# Patient Record
Sex: Female | Born: 1985 | Race: White | Hispanic: No | Marital: Single | Smoking: Former smoker
Health system: Southern US, Community
[De-identification: ages and names within clinical notes are randomized; demographics above are authoritative.]

## PROBLEM LIST (undated history)

## (undated) DIAGNOSIS — K5090 Crohn's disease, unspecified, without complications: Secondary | ICD-10-CM

## (undated) DIAGNOSIS — K633 Ulcer of intestine: Secondary | ICD-10-CM

## (undated) HISTORY — DX: Crohn's disease, unspecified, without complications: K50.90

## (undated) HISTORY — DX: Ulcer of intestine: K63.3

## (undated) HISTORY — PX: OTHER SURGICAL HISTORY: SHX169

---
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
## 2007-08-04 ENCOUNTER — Encounter (INDEPENDENT_AMBULATORY_CARE_PROVIDER_SITE_OTHER): Payer: Self-pay | Admitting: *Deleted

## 2007-08-05 ENCOUNTER — Encounter (INDEPENDENT_AMBULATORY_CARE_PROVIDER_SITE_OTHER): Payer: Self-pay | Admitting: *Deleted

## 2007-08-17 ENCOUNTER — Encounter: Payer: Self-pay | Admitting: Internal Medicine

## 2009-06-19 ENCOUNTER — Encounter (INDEPENDENT_AMBULATORY_CARE_PROVIDER_SITE_OTHER): Payer: Self-pay | Admitting: *Deleted

## 2009-11-12 ENCOUNTER — Encounter: Payer: Self-pay | Admitting: Internal Medicine

## 2009-12-19 DIAGNOSIS — N946 Dysmenorrhea, unspecified: Secondary | ICD-10-CM

## 2009-12-19 DIAGNOSIS — K5090 Crohn's disease, unspecified, without complications: Secondary | ICD-10-CM | POA: Insufficient documentation

## 2009-12-19 DIAGNOSIS — K633 Ulcer of intestine: Secondary | ICD-10-CM

## 2009-12-24 ENCOUNTER — Encounter: Payer: Self-pay | Admitting: Internal Medicine

## 2009-12-29 DIAGNOSIS — K120 Recurrent oral aphthae: Secondary | ICD-10-CM | POA: Insufficient documentation

## 2009-12-30 ENCOUNTER — Ambulatory Visit: Payer: Self-pay | Admitting: Internal Medicine

## 2009-12-30 LAB — CONVERTED CEMR LAB
Eosinophils Relative: 3.6 % (ref 0.0–5.0)
Iron: 131 ug/dL (ref 42–145)
MCV: 88.9 fL (ref 78.0–100.0)
Monocytes Absolute: 0.5 10*3/uL (ref 0.1–1.0)
Neutrophils Relative %: 57.5 % (ref 43.0–77.0)
Platelets: 183 10*3/uL (ref 150.0–400.0)
Transferrin: 318.8 mg/dL (ref 212.0–360.0)
WBC: 8.8 10*3/uL (ref 4.5–10.5)

## 2010-03-25 ENCOUNTER — Ambulatory Visit: Payer: Self-pay | Admitting: Gastroenterology

## 2010-03-25 ENCOUNTER — Telehealth: Payer: Self-pay | Admitting: Internal Medicine

## 2010-03-25 DIAGNOSIS — R112 Nausea with vomiting, unspecified: Secondary | ICD-10-CM

## 2010-03-25 DIAGNOSIS — K219 Gastro-esophageal reflux disease without esophagitis: Secondary | ICD-10-CM

## 2010-03-26 ENCOUNTER — Ambulatory Visit: Payer: Self-pay | Admitting: Nurse Practitioner

## 2010-03-27 ENCOUNTER — Encounter: Payer: Self-pay | Admitting: Nurse Practitioner

## 2010-04-01 ENCOUNTER — Encounter: Payer: Self-pay | Admitting: Nurse Practitioner

## 2010-04-03 ENCOUNTER — Telehealth (INDEPENDENT_AMBULATORY_CARE_PROVIDER_SITE_OTHER): Payer: Self-pay | Admitting: *Deleted

## 2010-04-16 ENCOUNTER — Telehealth (INDEPENDENT_AMBULATORY_CARE_PROVIDER_SITE_OTHER): Payer: Self-pay | Admitting: *Deleted

## 2010-05-05 NOTE — Assessment & Plan Note (Signed)
Summary: CROHN'S DESEASE, ESTABLISH...AS.   History of Present Illness Visit Type: Initial Visit Primary GI MD: [name] MD Primary Avenir Lozinski: n/a Requesting Zendayah Hardgrave: Maxie Better, MD Chief Complaint: Establish care/ Crohn's History of Present Illness:   This is a 25 year old white female with a diagnosis of Crohn's colitis with terminal ileal involvement since 2006. She remembers having digestive problems at age 14 and again at age 3 when she was diagnosed by her pediatrician as having reflux. A colonoscopy in 2006 showed  aphthous ulcerations and colitis throughout the colon and terminal ileum. A small bowel capsule endoscopy completed in 2007 did not show any active disease. She had a normal terminal ileum on her colonoscopy in May 2009. The last colonoscopy in March 2011 showed two aphthous ulcerations in the terminal ileum and mild colitis in the rectum. She has occasional diarrhea and crampy abdominal pain for which she has taken Levsin. She is supposed to be on a low-residue diet but has eaten a regular diet. She denies any rectal bleeding. She is afraid of phlebotomies and avoids having lab tests completed. She has occasional aphthous ulcers in her mouth and occasional lower back pain and knee pains. She has never been on prednisone. She used to take Lialda but stopped taking it at some point.   GI Review of Systems    Reports abdominal pain, acid reflux, nausea, and  vomiting.     Location of  Abdominal pain: generalized.    Denies belching, bloating, chest pain, dysphagia with liquids, dysphagia with solids, heartburn, loss of appetite, vomiting blood, weight loss, and  weight gain.      Reports constipation and  diarrhea.     Denies anal fissure, black tarry stools, change in bowel habit, diverticulosis, fecal incontinence, heme positive stool, hemorrhoids, irritable bowel syndrome, jaundice, light color stool, liver problems, rectal bleeding, and  rectal pain.    Current  Medications (verified): 1)  Birth Control .... As Directed  Allergies (verified): No Known Drug Allergies  Past History:  Past Medical History: Reviewed history from 12/19/2009 and no changes required. Current Problems:  DYSMENORRHEA (ICD-625.3) ULCERATION OF INTESTINE (ICD-569.82) CROHN'S DISEASE (ICD-555.9)    Past Surgical History: Reviewed history from 12/19/2009 and no changes required. unremarkable  Family History: Reviewed history from 12/19/2009 and no changes required. Family History of Diabetes: Cousins Family History of Heart Disease:  Leukemia-Cousin  Social History: Reviewed history from 12/19/2009 and no changes required. Patient currently smokes.  Alcohol Use - no Daily Caffeine Use-2 drinks daily Illicit Drug Use - no  Review of Systems       The patient complains of allergy/sinus, fatigue, fever, headaches-new, itching, shortness of breath, and skin rash.  The patient denies anemia, anxiety-new, arthritis/joint pain, back pain, blood in urine, breast changes/lumps, change in vision, confusion, cough, coughing up blood, depression-new, fainting, hearing problems, heart murmur, heart rhythm changes, menstrual pain, muscle pains/cramps, night sweats, nosebleeds, pregnancy symptoms, sleeping problems, sore throat, swelling of feet/legs, swollen lymph glands, thirst - excessive , urination - excessive , urination changes/pain, urine leakage, vision changes, and voice change.         Pertinent positive and negative review of systems were noted in the above HPI. All other ROS was otherwise negative.   Vital Signs:  Patient profile:   25 year old female Height:      64 inches Weight:      122.06 pounds BMI:     21.03 Pulse rate:   80 / minute  Pulse rhythm:   regular BP sitting:   96 / 52  (right arm) Cuff size:   regular  Vitals Entered By: June McMurray CMA Duncan Dull) (December 30, 2009 2:05 PM)  Physical Exam  General:  healthy-appearing, in no  distress. Eyes:  PERRLA, no icterus. Mouth:  no aphthous ulcers. Neck:  Supple; no masses or thyromegaly. Lungs:  Clear throughout to auscultation. Heart:  Regular rate and rhythm; no murmurs, rubs,  or bruits. Abdomen:  soft abdomen with rectal quadrant tenderness. No rebound. No palpable mass. No distention. Rectal:  soft Hemoccult negative stool. No perianal disease. Msk:  Symmetrical with no gross deformities. Normal posture. Extremities:  No clubbing, cyanosis, edema or deformities noted. Skin:  Intact without significant lesions or rashes. Psych:  Alert and cooperative. Normal mood and affect.   Impression & Recommendations:  Problem # 1:  APHTHOUS ULCERS (ICD-528.2) Patient has a history of aphthous ulcers, currently not present.  Problem # 2:  REGIONAL ENTERITIS OF UNSPECIFIED SITE (ICD-555.9) Patient has Crohn's colitis and ileitis by history, probably on at least 3 colonoscopies. Her last one was in March 2011. We will start her on Lialda 1.2 g to take 2 tablets a day. We will also obtain labs today including a CBC and iron studies.  Other Orders: TLB-CBC Platelet - w/Differential (85025-[id number]) TLB-IBC Pnl (Iron/FE;Transferrin) (83550-IBC)  Patient Instructions: 1)  continue low residue diet. 2)  CBC and iron studies. 3)  Lialda 1.2 g, 2 tablets daily. 4)  Integra Iron samples given to patient. 5)  Office visit 6 months. 6)  Repeat colonoscopy in 5 years or earlier as necessary. 7)  Consider IBD markers. 8)  Copy sent to : Dr Nelta Numbers 9)  The medication list was reviewed and reconciled.  All changed / newly prescribed medications were explained.  A complete medication list was provided to the patient / caregiver. Prescriptions: LEVSIN 0.125 MG TABS (HYOSCYAMINE SULFATE) Take 1 tablet by mouth before meals (three times a day)  #90 x 4   Entered by:   Lamona Curl CMA (AAMA)   Authorized by:   Hart Carwin MD   Signed by:   Lamona Curl CMA (AAMA) on  12/30/2009   Method used:   Electronically to        [facility] [address] (retail)       [phone]       [place], [place]  [address]       Ph: [phone]       Fax: [phone]   RxID:   [id number] LIALDA 1.2 GM TBEC (MESALAMINE) Take 2 tablets by mouth once daily  #60 x 4   Entered by:   Lamona Curl CMA (AAMA)   Authorized by:   Hart Carwin MD   Signed by:   Lamona Curl CMA (AAMA) on 12/30/2009   Method used:   Electronically to        [facility] [address] (retail)       [phone]       [place], [place]  [address]       Ph: [phone]       Fax: [phone]   RxID:   [id number]

## 2010-05-05 NOTE — Letter (Signed)
Summary: [facility]   Imported By: Sherian Rein 11/25/2009 11:56:37  _____________________________________________________________________  External Attachment:    Type:   Image     Comment:   External Document

## 2010-05-07 NOTE — Progress Notes (Signed)
  Phone Note Outgoing Call   Call placed by: Jesse Fall Call placed to: Patient Summary of Call: Called to check on how patient is doing since her visit last week for Crohn's flare.. She states she is better. She is wondering if she needs to schedule a f/u visit. Please, advise. Initial call taken by: Jesse Fall RN,  April 03, 2010 9:20 AM  Follow-up for Phone Call        please schedule visit  for late feb. 2012 providing she is doing well. Follow-up by: Hart Carwin MD,  April 04, 2010 10:05 PM     Appended Document:  Scheduled patient for 06/01/10 at 3:45 PM with Dr. Juanda Chance.

## 2010-05-07 NOTE — Assessment & Plan Note (Signed)
Summary: crohn's flare/Regina   History of Present Illness Visit Type: Follow-up Visit Primary GI MD: [name] MD Primary Provider: na Requesting Provider: na Chief Complaint: Lower abd pain, diarrhea, cramping, BRB in stool and fever at night  History of Present Illness:   On Lialda 2 daily.  Since September she has had a bad week here and there. For about 5 days she has had at least 12 BMs a day. There is bright red blood toward end of BM  which she feels is from straining. She has horrible cramps in  lower abdomen. Out of Levsin which usually helps but recently it hasn't been as effective. She is nauseated every evening between 4am and 10am, vomits bilious material twice or so a week. Sometimes emesis contains undigested food.  The nausea has been present for 8 months now.She hasn't recently run fevers up to 100.5. No recent antibiotics. No NSAIDS  Her weight is up 3 pounds, stopped smoking a few months ago.   Recently developed problems with heartburn in the evenings.   Patient and husband open to having children. They are not actively trying but not doing anything to prevent pregnancy. Last pregnancy test was last and was negative.    GI Review of Systems    Reports abdominal pain and  weight gain.     Location of  Abdominal pain: lower abdomen.    Denies acid reflux, belching, bloating, chest pain, dysphagia with liquids, dysphagia with solids, heartburn, loss of appetite, nausea, vomiting, vomiting blood, and  weight loss.      Reports diarrhea and  rectal bleeding.     Denies anal fissure, black tarry stools, change in bowel habit, constipation, diverticulosis, fecal incontinence, heme positive stool, hemorrhoids, irritable bowel syndrome, jaundice, light color stool, liver problems, and  rectal pain.    Current Medications (verified): 1)  Lialda 1.2 Gm Tbec (Mesalamine) .... Take 2 Tablets By Mouth Once Daily 2)  Levsin 0.125 Mg Tabs (Hyoscyamine Sulfate) .... Take 1 Tablet  By Mouth Before Meals (Three Times A Day)  Allergies (verified): No Known Drug Allergies  Past History:  Past Medical History: DYSMENORRHEA (ICD-625.3) ULCERATION OF INTESTINE (ICD-569.82) CROHN'S DISEASE (ICD-555.9)    Past Surgical History: Reviewed history from 12/19/2009 and no changes required. unremarkable  Family History: Family History of Diabetes: Cousins Family History of Heart Disease:  Leukemia-Cousin No FH of Colon Cancer:  Social History: Married Patient currently smokes.  Alcohol Use - no Daily Caffeine Use-2 drinks daily Illicit Drug Use - no  Review of Systems       The patient complains of allergy/sinus and fever.  The patient denies anemia, anxiety-new, arthritis/joint pain, back pain, blood in urine, breast changes/lumps, change in vision, confusion, cough, coughing up blood, depression-new, fainting, fatigue, headaches-new, hearing problems, heart murmur, heart rhythm changes, itching, menstrual pain, muscle pains/cramps, night sweats, nosebleeds, pregnancy symptoms, shortness of breath, skin rash, sleeping problems, sore throat, swelling of feet/legs, swollen lymph glands, thirst - excessive, urination - excessive, urination changes/pain, urine leakage, vision changes, and voice change.    Vital Signs:  Patient profile:   25 year old female Height:      64 inches Weight:      125 pounds BMI:     21.53 BSA:     1.60 Temp:     98.7 degrees F oral Pulse rate:   92 / minute Pulse rhythm:   regular BP sitting:   100 / 58  (left arm) Cuff size:  regular  Vitals Entered By: Ok Anis CMA (March 25, 2010 3:23 PM)  Physical Exam  General:  Well developed, well nourished, no acute distress. Head:  Normocephalic and atraumatic. Eyes:  Conjunctiva pink, no icterus.  Neck:  no obvious masses  Lungs:  Clear throughout to auscultation. Heart:  Regular rate and rhythm; no murmurs, rubs,  or bruits. Abdomen:  Abdomen soft,  nondistended.  Mild-moderate tenderness across lower abdomen. No obvious masses or hepatomegaly.Normal bowel sounds.  Rectal:  No external or internal abnormalities appreciated. No stool in vault. Msk:  Symmetrical with no gross deformities. Normal posture. Extremities:  No palmar erythema, no edema.  Neurologic:  Alert and  oriented x4;  grossly normal neurologically. Skin:  Intact without significant lesions or rashes. Cervical Nodes:  No significant cervical adenopathy. Psych:  Alert and cooperative. Normal mood and affect.   Impression & Recommendations:  Problem # 1:  REGIONAL ENTERITIS OF UNSPECIFIED SITE (ICD-555.9) Assessment Deteriorated Several day history of lower abdominal pain, increased loose stools, low grade temp. Recommended labs, stool studies. Patient doesn't want labs, she has fear of venipuncture. Will increase Lialda to 4.8gms/day. We talked about a short course of Prednisone but she is reluctant. Will refill Levsin. Patient will follow up with Dr. Juanda Chance but in the meantime patient will call us if symptoms do not improve. Should that be the case, she will likely need a course of Prednisone.  Will notify her if stool studies are positive.  Other Orders: T-C diff by PCR ([id number])  Patient Instructions: 1)  Please go to lab, basement level. 2)  Increase the Lialda to 4 tablets daily. 3)  We will fax the Lialda perscription to [facility].  4)  The medication list was reviewed and reconciled.  All changed / newly prescribed medications were explained.  A complete medication list was provided to the patient / caregiver. Prescriptions: LOMOTIL 2.5-0.025 MG TABS (DIPHENOXYLATE-ATROPINE) Take 1 tab twice daily as needed  #60 x 0   Entered by:   Lowry Ram NCMA   Authorized by:   Willette Cluster NP   Signed by:   Lowry Ram NCMA on 03/25/2010   Method used:   Printed then faxed to ...       [facility] [address] (retail)       [phone]       [place], [place]  [address]       Ph: [phone]       Fax: [phone]   RxID:   [id number]

## 2010-05-07 NOTE — Progress Notes (Signed)
  Phone Note Other Incoming   Request: Send information Summary of Call: Request for records received from [facility]. Request forwarded to [employer].  04/05/2001

## 2010-05-07 NOTE — Progress Notes (Signed)
Summary: Triage  Phone Note Call from Patient Call back at Home Phone [phone]   Caller: Patient Call For: Dr. Juanda Chance Reason for Call: Talk to Nurse Summary of Call: Has Crohn's and in the middle of a flareup Initial call taken by: Karna Christmas,  March 25, 2010 10:33 AM  Follow-up for Phone Call        Patient states she has a "flare" that started 5 days ago. She is having diarrhea with urgency and cramping. States she has had 12 diarrhea stools today. Stool is mixed with bright red blood. Sheis having abdominal pain below the belly button. States she "only has a fever in the evenings." Denies recent antibiotic use. She is having nausea without vomiting. Currently taking Lialda and Levsin. Patient states she is not sure she can get into the office today but she will try. Scheduled patient with Willette Cluster, RNP today at 3:30 PM Follow-up by: Jesse Fall RN,  March 25, 2010 10:55 AM     Appended Document: Triage please add Flagyl 250 mg by mouth three times a day x 1 week, #21, also refill on LevsinSL.125 if not done. Also obtain stool for lactoferrin, C&S, C Diff. Thanx. Call us in 1 week with an update.  Appended Document: Triage Patient is coming to get stool specimen containers today. She understands to start the Flagyl after obtaining the specimens. Rxs to patient's pharmacy. She will call next week with update on her condition.  Appended Document: Triage   Appended Document: Triage    Clinical Lists Changes  Medications: Added new medication of METRONIDAZOLE 250 MG TABS (METRONIDAZOLE) Take one by mouth three times a day for one week. - Signed Rx of METRONIDAZOLE 250 MG TABS (METRONIDAZOLE) Take one by mouth three times a day for one week.;  #21 x 0;  Signed;  Entered by: Jesse Fall RN;  Authorized by: Hart Carwin MD;  Method used: Electronically to [facility] [phone] [place], [place], [place]  [address], Ph:  [phone], Fax: [phone] Rx of LEVSIN 0.125 MG TABS (HYOSCYAMINE SULFATE) Take 1 tablet by mouth before meals (three times a day);  #90 x 0;  Signed;  Entered by: Jesse Fall RN;  Authorized by: Hart Carwin MD;  Method used: Electronically to [facility] [phone] [place], [place], [place]  [address], Ph: [phone], Fax: [phone]    Prescriptions: LEVSIN 0.125 MG TABS (HYOSCYAMINE SULFATE) Take 1 tablet by mouth before meals (three times a day)  #90 x 0   Entered by:   Jesse Fall RN   Authorized by:   Hart Carwin MD   Signed by:   Jesse Fall RN on 03/26/2010   Method used:   Electronically to        [facility] [address] (retail)       [phone]       [place], [place]  [address]       Ph: [phone]       Fax: [phone]   RxID:   [id number] METRONIDAZOLE 250 MG TABS (METRONIDAZOLE) Take one by mouth three times a day for one week.  #21 x 0   Entered by:   Jesse Fall RN   Authorized by:   Hart Carwin MD   Signed by:   Jesse Fall RN on 03/26/2010   Method used:   Electronically to  [facility] [address] (retail)       [phone]       [place], [place]  [address]       Ph: [phone]       Fax: [phone]   RxID:   [id number]

## 2010-06-01 ENCOUNTER — Ambulatory Visit: Payer: Self-pay | Admitting: Internal Medicine

## 2010-06-01 ENCOUNTER — Emergency Department: Payer: Self-pay | Admitting: Emergency Medicine

## 2010-06-02 NOTE — Letter (Signed)
Summary: [facility]  [facility]   Imported By: Lamona Curl CMA (AAMA) 05/26/2010 11:20:36  _____________________________________________________________________  External Attachment:    Type:   Image     Comment:   External Document

## 2010-06-02 NOTE — Letter (Signed)
Summary: [facility] BX  [facility] BX   Imported By: Lamona Curl CMA (AAMA) 05/26/2010 11:17:04  _____________________________________________________________________  External Attachment:    Type:   Image     Comment:   External Document

## 2010-06-02 NOTE — Letter (Signed)
Summary: ROI for Dr Derinda Late [facility]  ROI for Dr Derinda Late [facility]   Imported By: Lamona Curl CMA (AAMA) 05/26/2010 11:22:13  _____________________________________________________________________  External Attachment:    Type:   Image     Comment:   External Document

## 2010-06-02 NOTE — Letter (Signed)
Summary: Labs (Multiple Dates) from [facility] (Multiple Dates) from [facility]   Imported By: Lamona Curl CMA (AAMA) 05/26/2010 11:18:17  _____________________________________________________________________  External Attachment:    Type:   Image     Comment:   External Document

## 2010-06-02 NOTE — Letter (Signed)
Summary: [facility]   Imported By: Lamona Curl CMA (AAMA) 05/26/2010 11:21:04  _____________________________________________________________________  External Attachment:    Type:   Image     Comment:   External Document

## 2010-06-02 NOTE — Letter (Signed)
Summary: [facility]  [facility]   Imported By: Lamona Curl CMA (AAMA) 05/26/2010 11:20:08  _____________________________________________________________________  External Attachment:    Type:   Image     Comment:   External Document

## 2010-06-02 NOTE — Letter (Signed)
Summary: [facility]  [facility]   Imported By: Lamona Curl CMA (AAMA) 05/26/2010 11:16:01  _____________________________________________________________________  External Attachment:    Type:   Image     Comment:   External Document

## 2010-06-02 NOTE — Letter (Signed)
Summary: [facility]  [facility]   Imported By: Lamona Curl CMA (AAMA) 05/26/2010 11:19:00  _____________________________________________________________________  External Attachment:    Type:   Image     Comment:   External Document

## 2010-06-02 NOTE — Letter (Signed)
Summary: [facility]   Imported By: Lamona Curl CMA (AAMA) 05/26/2010 11:16:28  _____________________________________________________________________  External Attachment:    Type:   Image     Comment:   External Document

## 2010-06-02 NOTE — Letter (Signed)
Summary: [facility]  [facility]   Imported By: Lamona Curl CMA (AAMA) 05/26/2010 11:19:37  _____________________________________________________________________  External Attachment:    Type:   Image     Comment:   External Document

## 2010-06-02 NOTE — Letter (Signed)
Summary: [facility]  [facility]   Imported By: Lamona Curl CMA (AAMA) 05/26/2010 11:17:34  _____________________________________________________________________  External Attachment:    Type:   Image     Comment:   External Document

## 2010-10-28 ENCOUNTER — Ambulatory Visit: Payer: Self-pay | Admitting: Internal Medicine

## 2010-11-25 ENCOUNTER — Encounter: Payer: Self-pay | Admitting: Internal Medicine

## 2010-11-25 ENCOUNTER — Ambulatory Visit (INDEPENDENT_AMBULATORY_CARE_PROVIDER_SITE_OTHER): Payer: Managed Care, Other (non HMO) | Admitting: Internal Medicine

## 2010-11-25 VITALS — BP 110/60 | HR 88 | Ht 64.0 in | Wt 153.4 lb

## 2010-11-25 DIAGNOSIS — K5090 Crohn's disease, unspecified, without complications: Secondary | ICD-10-CM

## 2010-11-25 DIAGNOSIS — K5080 Crohn's disease of both small and large intestine without complications: Secondary | ICD-10-CM

## 2010-11-25 DIAGNOSIS — F419 Anxiety disorder, unspecified: Secondary | ICD-10-CM | POA: Insufficient documentation

## 2010-11-25 DIAGNOSIS — F411 Generalized anxiety disorder: Secondary | ICD-10-CM

## 2010-11-25 MED ORDER — MESALAMINE 1.2 G PO TBEC: DELAYED_RELEASE_TABLET | ORAL | Status: AC

## 2010-11-25 NOTE — Patient Instructions (Addendum)
We have sent the following medications to your pharmacy for you to pick up at your convenience: Lialda We have given you a brochure with information for Dr Dellia Cloud. Please contact his office for an appointment.  [facility] in [address]

## 2010-11-25 NOTE — Progress Notes (Signed)
Barbara Salas [birth year] MRN [id number]     History of Present Illness:  This is a 25 year old white female with Crohn's colitis and ileitis diagnosed  in 2006 when a colonoscopy showed aphthous ulcers throughout the colon and terminal ileum. A small bowel capsule endoscopy in 2007 showed no active disease. A repeat colonoscopy in May 2009 showed a normal terminal ileum and colon. Her most recent colonoscopy in March 2011 showed aphthous ulcerations in the terminal ileum and mild proctitis. She was last seen by me for the first time  in September 2011 and and again by Willette Cluster NP in December 2011 with a flareup of diarrhea. She was around 122 pounds then. She became pregnant shortly after that and is due to deliver in 6 weeks. She has gained 30 pounds. She is on Lialda 4.8 g daily. She is still having diarrhea but no rectal bleeding or abdominal pain. She has occasional stomatitis and low back pain. Her blood count has been monitored at [facility] in [place]. She has been on prenatal vitamins. Patient is afraid of phlebotomies and has turned down requests for blood tests. She was a no-show for an appointment in February 2012. She is planning to breast feed after delivery. She wanted to know if she should have a C-section or if she can have a vaginal delivery considering her Crohn's disease.   Past Medical History  Diagnosis Date  . Intestinal ulceration   . Crohn disease   . Regional enteritis of unspecified site    Past Surgical History  Procedure Date  . None     reports that she quit smoking about 13 months ago. She has never used smokeless tobacco. She reports that she does not drink alcohol or use illicit drugs. family history includes Diabetes in her cousin; Heart disease in an unspecified family member; and Leukemia in her cousin.  There is no history of Colon cancer. No Known Allergies      Review of Systems: Denies defect dysphagia shortness of breath chest pain or  rectal bleeding  The remainder of the 10  point ROS is negative except as outlined in H&P   Physical Exam: General appearance  Well developed, in no distress, 34 weeks of pregnancy. Eyes- non icteric. HEENT nontraumatic, normocephalic. Mouth no lesions, tongue papillated, no cheilosis. Neck supple without adenopathy, thyroid not enlarged, no carotid bruits, no JVD. Lungs Clear to auscultation bilaterally. Cor normal S1 normal S2, regular rhythm , no murmur,  quiet precordium. Abdomen protuberant pregnant abdomen. Mildly tender. Normoactive bowel sounds. Rectal: Not done. Extremities no pedal edema. Skin no lesions. Neurological alert and oriented x 3. Psychological normal mood and affect.  Assessment and Plan:  Problem #1 Crohn's colitis and ileitis which is moderately active. She will continue her current dose of mesalamine at 4.8 g daily. Her blood tests are monitored by her gynecologist. There is a possibility of a Crohn's disease flareup after her delivery. We have discussed it and she may have to interrupt her breast-feeding and start on prednisone. She will call us if she flares up.  Problem #2 Intrauterine pregnancy with expected delivery in 6 weeks. Vaginal delivery is not contraindicated since she has never had a involvement of the rectum.  Problem #3 Chronic anxiety. Patient requests a referral to a psychologist for management of her anxiety associated with Crohn's disease and her general situation.   11/25/2010 [name]

## 2010-12-24 ENCOUNTER — Observation Stay: Payer: Self-pay

## 2011-01-14 ENCOUNTER — Inpatient Hospital Stay: Payer: Self-pay | Admitting: Obstetrics and Gynecology

## 2012-06-09 IMAGING — US US RENAL KIDNEY
1 series · 17 of 25 positions shown · non-contrast
Comparison: none

REASON FOR EXAM: right flank pain
COMMENTS:

[Series 1: us renal kidney · 17 of 31 slices shown]
[im 1/31]
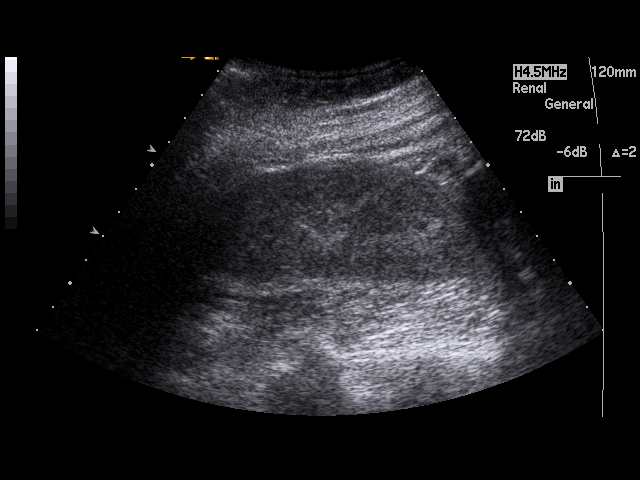
[im 3/31]
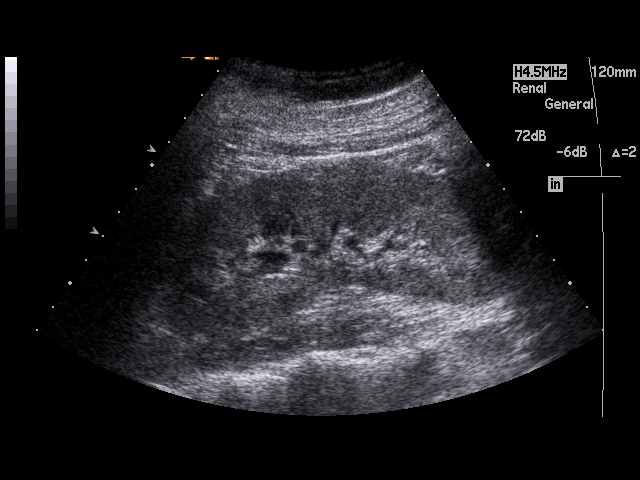
[im 4/31]
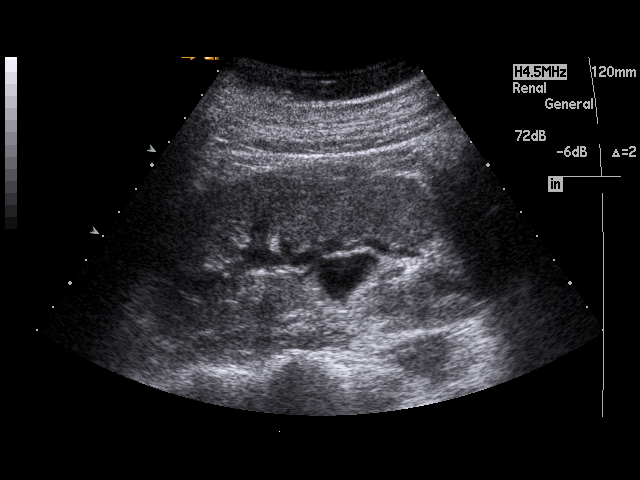
[im 7/31]
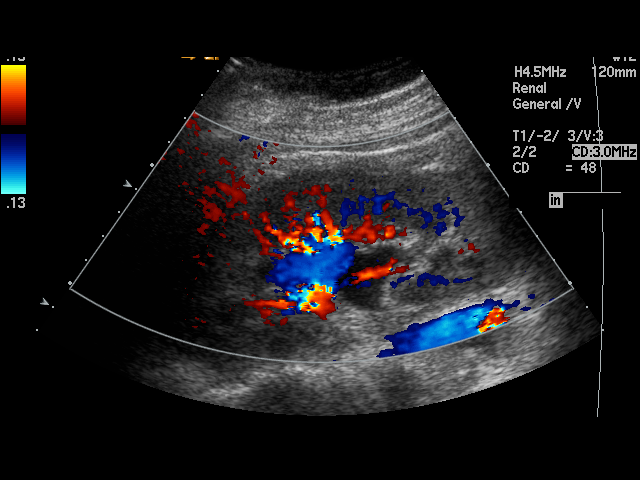
[im 8/31]
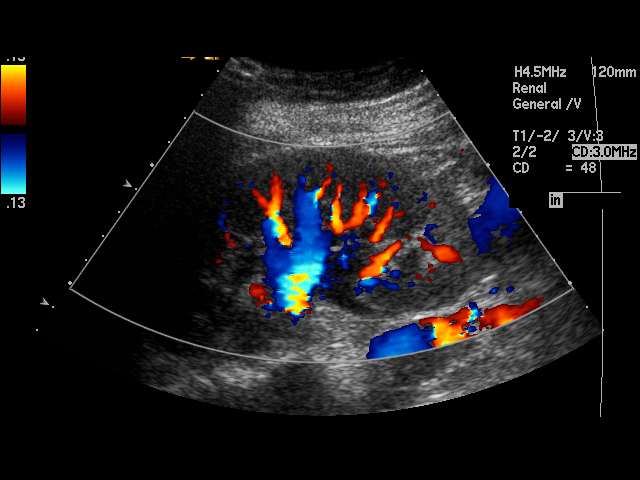
[im 11/31]
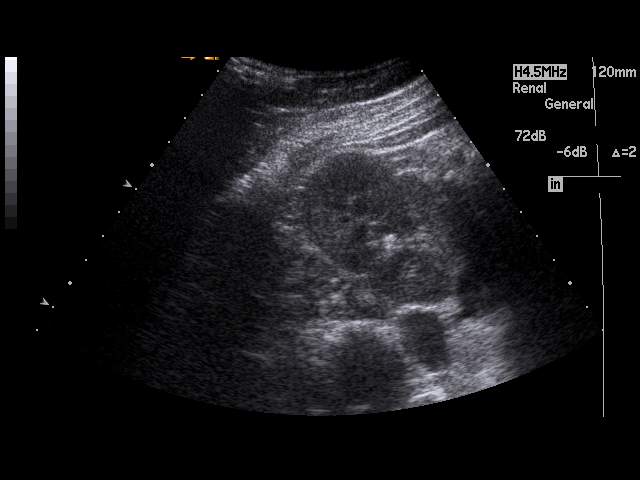
[im 12/31]
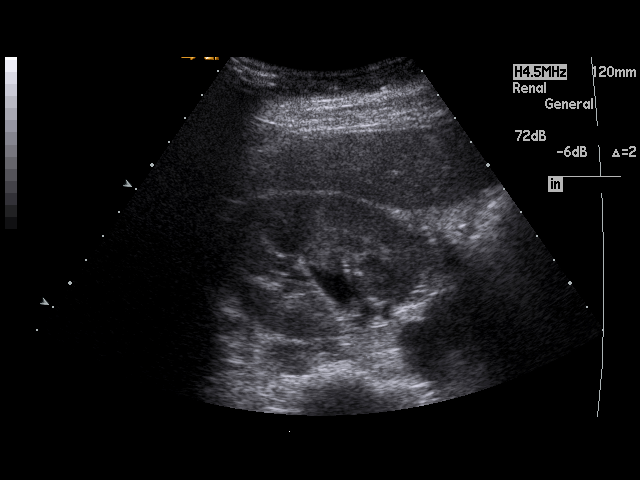
[im 14/31]
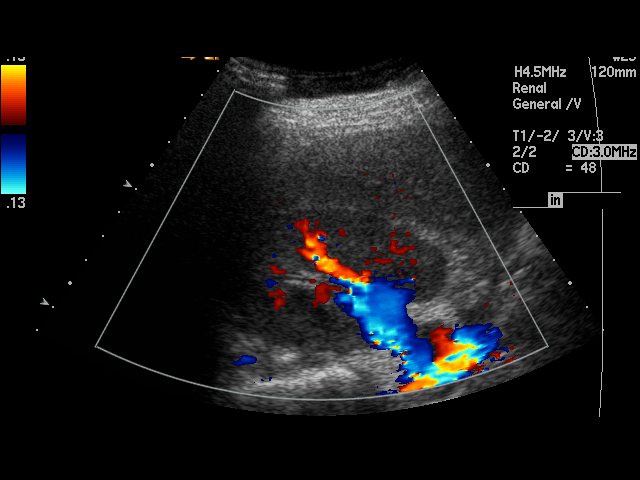
[im 16/31]
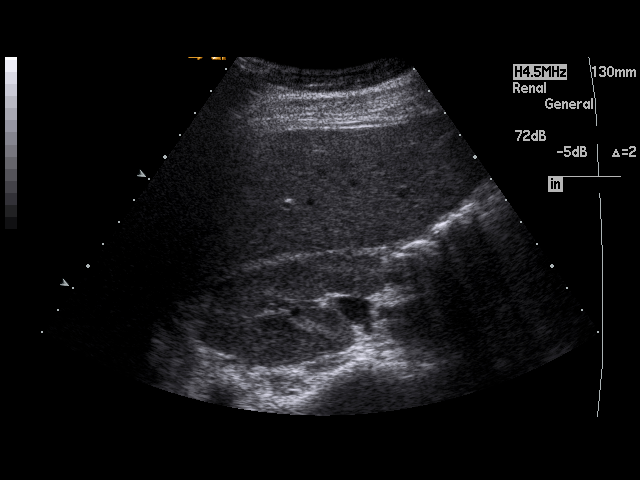
[im 17/31]
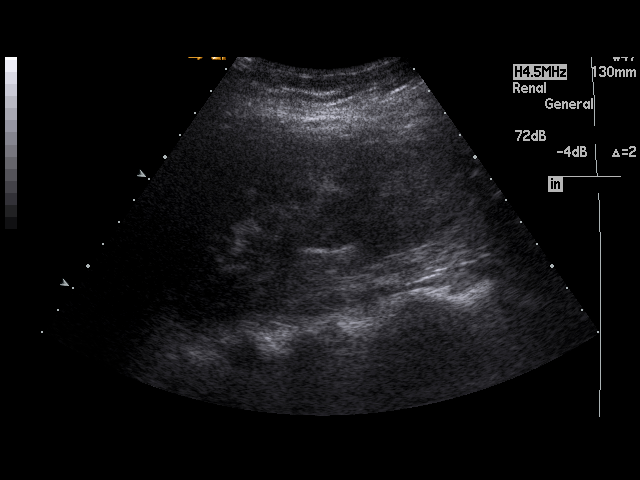
[im 19/31]
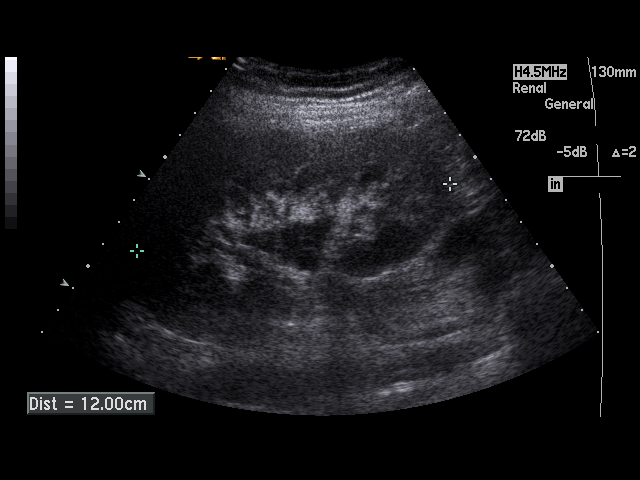
[im 21/31]
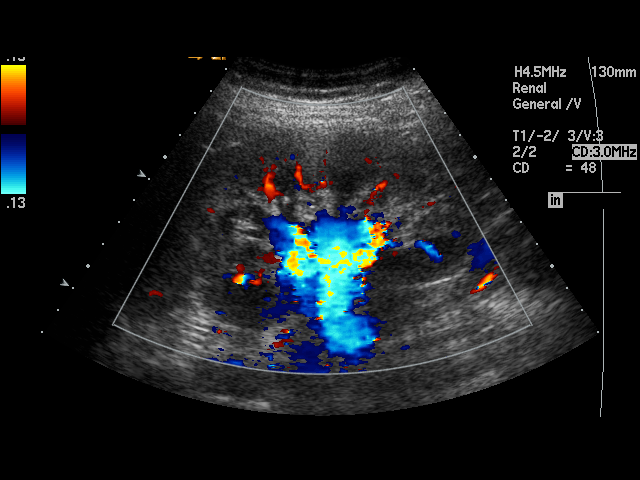
[im 23/31]
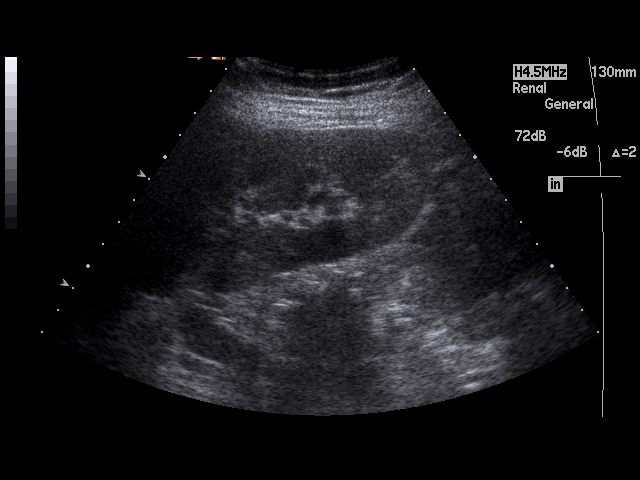
[im 24/31]
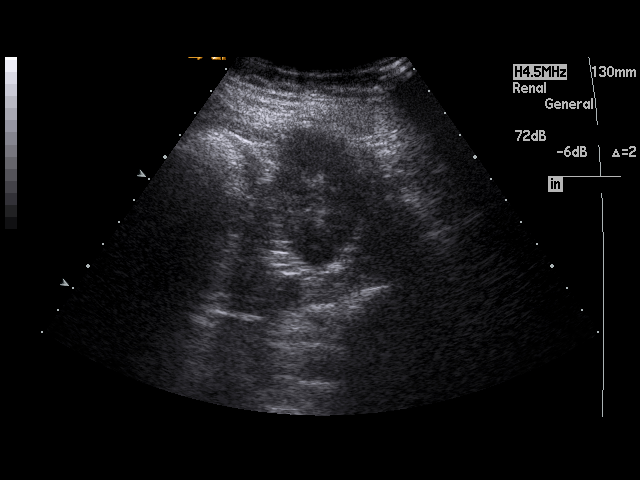
[im 27/31]
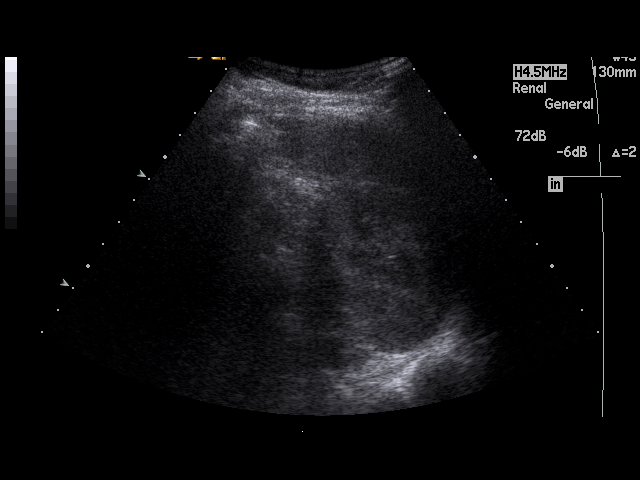
[im 28/31]
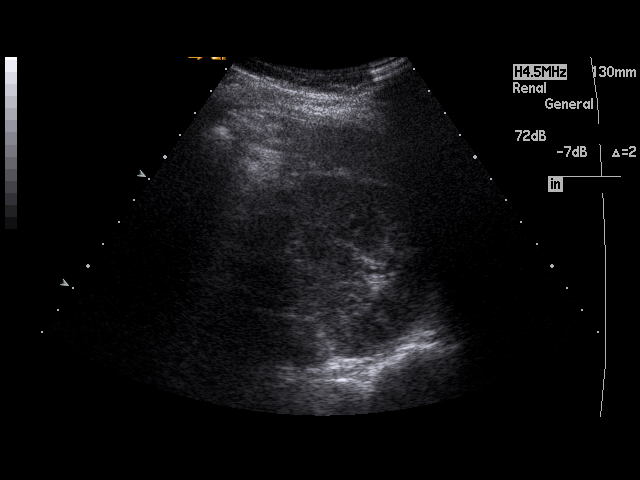
[im 31/31]
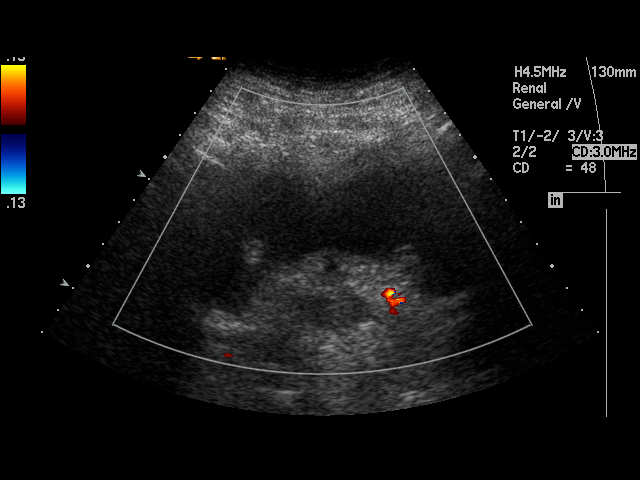

[17 of 25 positions shown; findings below may reference images not displayed]

PROCEDURE:     US  - US KIDNEY  - December 24, 2010  [DATE]

RESULT:     The right kidney measures 12.2 x 5.6 x 5.5 cm. There is mild
hydronephrosis. No definite shadowing stones are identified. The left kidney
measures 12 x 6.9 x 5.8 cm and exhibits no hydronephrosis. The perinephric
space exhibits no abnormal fluid collections.
IMPRESSION: There is mild hydronephrosis on the right likely secondary
to the gravid uterus. I see no hydronephrosis on the left.

## 2013-10-20 ENCOUNTER — Observation Stay: Payer: Self-pay | Admitting: Obstetrics and Gynecology

## 2013-10-20 LAB — CBC WITH DIFFERENTIAL/PLATELET
Basophil #: 0 10*3/uL (ref 0.0–0.1)
Basophil %: 0.2 %
EOS PCT: 1.5 %
Eosinophil #: 0.2 10*3/uL (ref 0.0–0.7)
HCT: 34.7 % — ABNORMAL LOW (ref 35.0–47.0)
HGB: 11.6 g/dL — ABNORMAL LOW (ref 12.0–16.0)
Lymphocyte #: 2 10*3/uL (ref 1.0–3.6)
Lymphocyte %: 12.7 %
MCH: 30.1 pg (ref 26.0–34.0)
MCHC: 33.3 g/dL (ref 32.0–36.0)
MCV: 90 fL (ref 80–100)
Monocyte #: 0.9 x10 3/mm (ref 0.2–0.9)
Monocyte %: 5.8 %
NEUTROS ABS: 12.2 10*3/uL — AB (ref 1.4–6.5)
Neutrophil %: 79.8 %
PLATELETS: 162 10*3/uL (ref 150–440)
RBC: 3.85 10*6/uL (ref 3.80–5.20)
RDW: 13.3 % (ref 11.5–14.5)
WBC: 15.4 10*3/uL — AB (ref 3.6–11.0)

## 2013-10-20 LAB — COMPREHENSIVE METABOLIC PANEL
ALBUMIN: 2.4 g/dL — AB (ref 3.4–5.0)
ANION GAP: 7 (ref 7–16)
Alkaline Phosphatase: 98 U/L
BILIRUBIN TOTAL: 0.3 mg/dL (ref 0.2–1.0)
BUN: 6 mg/dL — AB (ref 7–18)
CHLORIDE: 107 mmol/L (ref 98–107)
Calcium, Total: 8.8 mg/dL (ref 8.5–10.1)
Co2: 25 mmol/L (ref 21–32)
Creatinine: 0.53 mg/dL — ABNORMAL LOW (ref 0.60–1.30)
EGFR (African American): >60
EGFR (Non-African Amer.): >60
GLUCOSE: 86 mg/dL (ref 65–99)
Osmolality: 274 (ref 275–301)
Potassium: 3.5 mmol/L (ref 3.5–5.1)
SGOT(AST): 18 U/L (ref 15–37)
SGPT (ALT): 15 U/L (ref 12–78)
SODIUM: 139 mmol/L (ref 136–145)
Total Protein: 6.4 g/dL (ref 6.4–8.2)

## 2013-10-20 LAB — URINALYSIS, COMPLETE
BILIRUBIN, UR: NEGATIVE
Blood: NEGATIVE
Glucose,UR: NEGATIVE mg/dL (ref 0–75)
Ketone: NEGATIVE
Leukocyte Esterase: NEGATIVE
NITRITE: NEGATIVE
PH: 6 (ref 4.5–8.0)
Protein: NEGATIVE
RBC,UR: 3 /HPF (ref 0–5)
Specific Gravity: 1.016 (ref 1.003–1.030)
WBC UR: 2 /HPF (ref 0–5)

## 2013-10-20 LAB — LIPASE, BLOOD: Lipase: 88 U/L (ref 73–393)

## 2013-11-22 ENCOUNTER — Ambulatory Visit: Payer: Self-pay | Admitting: Obstetrics and Gynecology

## 2013-11-22 LAB — CBC WITH DIFFERENTIAL/PLATELET
BASOS ABS: 0.1 10*3/uL (ref 0.0–0.1)
Basophil %: 0.4 %
EOS PCT: 2.3 %
Eosinophil #: 0.3 10*3/uL (ref 0.0–0.7)
HCT: 36.4 % (ref 35.0–47.0)
HGB: 11.8 g/dL — AB (ref 12.0–16.0)
Lymphocyte #: 2 10*3/uL (ref 1.0–3.6)
Lymphocyte %: 14.7 %
MCH: 29.1 pg (ref 26.0–34.0)
MCHC: 32.5 g/dL (ref 32.0–36.0)
MCV: 90 fL (ref 80–100)
MONO ABS: 0.8 x10 3/mm (ref 0.2–0.9)
Monocyte %: 5.7 %
Neutrophil #: 10.5 10*3/uL — ABNORMAL HIGH (ref 1.4–6.5)
Neutrophil %: 76.9 %
Platelet: 175 10*3/uL (ref 150–440)
RBC: 4.06 10*6/uL (ref 3.80–5.20)
RDW: 14.1 % (ref 11.5–14.5)
WBC: 13.6 10*3/uL — AB (ref 3.6–11.0)

## 2013-11-23 ENCOUNTER — Inpatient Hospital Stay: Payer: Self-pay | Admitting: Obstetrics and Gynecology

## 2013-11-24 LAB — HEMATOCRIT: HCT: 35.1 % (ref 35.0–47.0)

## 2014-02-04 ENCOUNTER — Encounter: Payer: Self-pay | Admitting: Internal Medicine
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
## 2014-07-27 NOTE — Op Note (Signed)
PATIENT NAME:  Barbara Salas, Barbara Salas MR#:  [id number] DATE OF BIRTH:  [birth year]  DATE OF PROCEDURE:  11/24/2013  PREOPERATIVE DIAGNOSES:  69.  A 29 year old G2, P1, 0-0-1, at 67 weeks' gestation.  2.  History of prior low transverse cesarean section, desiring repeat.   POSTOPERATIVE DIAGNOSES: 35.  A 29 year old G2, P1, 0-0-1, at 31 weeks' gestation.  2.  History of prior low transverse cesarean section, desiring repeat.   PROCEDURE PERFORMED: Repeat low transverse cesarean section via Pfannenstiel skin incision.   ANESTHESIA USED: General.   PRIMARY SURGEON: Florina Ou. Bonney Aid, M.D.   ASSISTANT: [place] Bing, MD  PREOPERATIVE ANTIBIOTICS: 2 g of Ancef.   DRAINS OR TUBES: Foley to gravity drainage. On-Q catheter system.   IMPLANTS: None.   ESTIMATED BLOOD LOSS: 700 mL   OPERATIVE FLUIDS: 1 L   COMPLICATIONS: None.   INTRAOPERATIVE FINDINGS: Normal tubes, ovaries, and uterus. No scarring encountered from the prior C-section. Delivery resulted in the birth of a liveborn female infant weighing 3570 g, 7 pounds 14 ounces, Apgars 9 and 10 at one and five minutes.   SPECIMENS REMOVED: None.   PATIENT CONDITION FOLLOWING THE PROCEDURE: Stable.   PROCEDURE IN DETAIL: The risks, benefits, and alternatives of the procedure were discussed with the patient prior to proceeding to the operating room. The patient was taken to the operating room, where she was administered spinal anesthesia. She was positioned in the supine position, prepped and draped in the usual sterile fashion. A timeout was performed. The level of anesthetic was checked and noted to be inadequate prior to proceeding with the case, at which point the decision was made to proceed with general anesthesia. Once induction of general anesthesia had been completed and the patient was successfully intubated, a Pfannenstiel skin incision was made, 2 cm above the pubic symphysis utilizing the patient's pre-existing scar. This was carried  down sharply to the level of the rectus fascia using a scalpel. The fascia was incised in the midline and extended using Mayo scissors. The superior border of the rectus fascia was grasped with 2 Coker clamps. The underlying rectus muscles were dissected off the fascia bluntly. The median raphe was incised using Mayo scissors. The inferior border of the rectus fascia was dissected off the rectus muscle in a similar fashion. The midline was identified and entered using a hemostat. The peritoneal incision was then extended using manual traction. A bladder blade was placed.   A low transverse incision was scored on the uterus using the knife. The uterus was then entered bluntly using the operator's finger and the hysterotomy incision extended using manual traction. Amniotomy was performed using an Allis clamp, noting clear fluid. Upon placing the operator's hand into the hysterotomy incision, the infant was noted to be in the OA position. The vertex was grasped, flexed, brought to the incision, delivered atraumatically using fundal pressure. The remainder of the body delivered with ease. The infant was suctioned. Cord was clamped and cut. The infant was passed to the awaiting pediatricians.   The placenta was delivered using manual extraction. The uterus was then exteriorized, wiped clean of clots and debris using 2 moist laps. The hysterotomy was closed using a 2-layer closure of 0 Vicryl, with the first being a running locked and the second a vertical imbricating. The uterus was returned to the abdomen. The peritoneal gutters were wiped clean of clots and debris using 2 moist laps. The hysterotomy incision was inspected and noted to be hemostatic.   The  peritoneum was closed using a 2-0 Vicryl in a running fashion. The rectus muscles were then reapproximated in the midline using a 2-0 Vicryl mattress stitch. The superior border of the fascia was grasped with a Coker clamp and the On-Q catheters were placed  superior to the incision and introduced subfascially per the usual protocol. Once these On-Q catheters had been successfully placed, the fascia was closed using a looped #1 PDS in a running fashion. The subcutaneous tissue was irrigated and hemostasis was achieved using the Bovie. Skin was closed using 4-0 Monocryl in subcuticular fashion. The incision was then dressed with Dermabond as were the On-Q catheters. Each On-Q catheter was bolused with 5 mL of 0.5% bupivacaine each.   Sponge, needle, and instrument counts were correct x 2.   The patient tolerated the procedure well and was taken to the recovery room in stable condition.    ____________________________ Florina OuAndreas M. Bonney AidStaebler, MD ams:MT D: 11/27/2013 17:19:00 ET T: 11/28/2013 05:54:10 ET JOB#: [id number]  cc: Florina OuAndreas M. Bonney AidStaebler, MD, <Dictator> Lorrene ReidANDREAS M Samona Chihuahua MD ELECTRONICALLY SIGNED 12/13/2013 16:01
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
## 2014-07-27 NOTE — Consult Note (Signed)
Chief Complaint:  Subjective/Chief Complaint seen for abdominal pain in the stetting of h/o crohns disease. today, no n/v or abdominal pain, no further diarrhea.  feeling better, has not yet started the lialda.   VITAL SIGNS/ANCILLARY NOTES: **Vital Signs.:   19-Jul-15 11:40  Vital Signs Type Routine  Temperature Temperature (F) 98  Celsius 36.6  Pulse Pulse 92  Respirations Respirations 18  Systolic BP Systolic BP 111  Diastolic BP (mmHg) Diastolic BP (mmHg) 59  Mean BP 76  Pulse Ox % Pulse Ox % 97  Pulse Ox Activity Level  At rest  Oxygen Delivery Room Air/ 21 %   Brief Assessment:  Cardiac Regular   Respiratory clear BS   Gastrointestinal details normal gravid, non-tender, bs positive normoactive.   Lab Results: Routine Chem:  18-Jul-15 13:05   Osmolality (calc) 274   Assessment/Plan:  Assessment/Plan:  Assessment 1) abdominal pain, diarrhea-none today.  tolerating po.   2) h/o crohns disease, not on maintenance for about 6 months.  3) 34+ weeks grav.   Plan 1) patient is to start back on lialda as recommended.  She will need to have GI fu once beyond this pregnancy.  Will sign off, reconsult as needed.   Electronic Signatures: Barnetta ChapelSkulskie, Martin (MD)  (Signed 19-Jul-15 14:44)  Authored: Chief Complaint, VITAL SIGNS/ANCILLARY NOTES, Brief Assessment, Lab Results, Assessment/Plan   Last Updated: 19-Jul-15 14:44 by Barnetta ChapelSkulskie, Martin (MD)
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
